# Patient Record
Sex: Male | Born: 1975 | Race: Black or African American | Hispanic: No | Marital: Single | Smoking: Former smoker
Health system: Southern US, Community
[De-identification: ages and names within clinical notes are randomized; demographics above are authoritative.]

## PROBLEM LIST (undated history)

## (undated) DIAGNOSIS — F319 Bipolar disorder, unspecified: Secondary | ICD-10-CM

## (undated) DIAGNOSIS — I219 Acute myocardial infarction, unspecified: Secondary | ICD-10-CM

## (undated) HISTORY — PX: HAND SURGERY: SHX662

## (undated) HISTORY — PX: CORONARY STENT PLACEMENT: SHX1402

## (undated) HISTORY — PX: KNEE SURGERY: SHX244

---

## 2014-11-23 ENCOUNTER — Emergency Department (HOSPITAL_COMMUNITY)
Admission: EM | Admit: 2014-11-23 | Discharge: 2014-11-24 | Disposition: A | Discharge status: Home / Self Care | Payer: Medicaid - Out of State | Attending: Emergency Medicine | Admitting: Emergency Medicine

## 2014-11-23 ENCOUNTER — Encounter (HOSPITAL_COMMUNITY): Payer: Self-pay | Admitting: Emergency Medicine

## 2014-11-23 ENCOUNTER — Emergency Department (HOSPITAL_COMMUNITY): Payer: Medicaid - Out of State

## 2014-11-23 DIAGNOSIS — M25519 Pain in unspecified shoulder: Secondary | ICD-10-CM

## 2014-11-23 DIAGNOSIS — F319 Bipolar disorder, unspecified: Secondary | ICD-10-CM

## 2014-11-23 DIAGNOSIS — M791 Myalgia: Secondary | ICD-10-CM | POA: Diagnosis not present

## 2014-11-23 DIAGNOSIS — Z87891 Personal history of nicotine dependence: Secondary | ICD-10-CM | POA: Diagnosis not present

## 2014-11-23 DIAGNOSIS — Z9861 Coronary angioplasty status: Secondary | ICD-10-CM | POA: Insufficient documentation

## 2014-11-23 DIAGNOSIS — I252 Old myocardial infarction: Secondary | ICD-10-CM | POA: Insufficient documentation

## 2014-11-23 DIAGNOSIS — R0789 Other chest pain: Secondary | ICD-10-CM | POA: Insufficient documentation

## 2014-11-23 DIAGNOSIS — I2510 Atherosclerotic heart disease of native coronary artery without angina pectoris: Secondary | ICD-10-CM

## 2014-11-23 DIAGNOSIS — Z79899 Other long term (current) drug therapy: Secondary | ICD-10-CM | POA: Insufficient documentation

## 2014-11-23 DIAGNOSIS — R079 Chest pain, unspecified: Secondary | ICD-10-CM | POA: Diagnosis present

## 2014-11-23 HISTORY — DX: Bipolar disorder, unspecified: F31.9

## 2014-11-23 HISTORY — DX: Acute myocardial infarction, unspecified: I21.9

## 2014-11-23 LAB — CBC WITH DIFFERENTIAL/PLATELET
BASOS ABS: 0 10*3/uL (ref 0.0–0.1)
Basophils Relative: 1 % (ref 0–1)
Eosinophils Absolute: 0.1 10*3/uL (ref 0.0–0.7)
Eosinophils Relative: 2 % (ref 0–5)
HEMATOCRIT: 42.8 % (ref 39.0–52.0)
HEMOGLOBIN: 14.3 g/dL (ref 13.0–17.0)
Lymphocytes Relative: 40 % (ref 12–46)
Lymphs Abs: 2.6 10*3/uL (ref 0.7–4.0)
MCH: 28.4 pg (ref 26.0–34.0)
MCHC: 33.4 g/dL (ref 30.0–36.0)
MCV: 85.1 fL (ref 78.0–100.0)
MONO ABS: 0.5 10*3/uL (ref 0.1–1.0)
MONOS PCT: 7 % (ref 3–12)
Neutro Abs: 3.3 10*3/uL (ref 1.7–7.7)
Neutrophils Relative %: 50 % (ref 43–77)
Platelets: 239 10*3/uL (ref 150–400)
RBC: 5.03 MIL/uL (ref 4.22–5.81)
RDW: 14.7 % (ref 11.5–15.5)
WBC: 6.4 10*3/uL (ref 4.0–10.5)

## 2014-11-23 LAB — COMPREHENSIVE METABOLIC PANEL
ALBUMIN: 4.4 g/dL (ref 3.5–5.0)
ALK PHOS: 75 U/L (ref 38–126)
ALT: 66 U/L — ABNORMAL HIGH (ref 17–63)
AST: 33 U/L (ref 15–41)
Anion gap: 9 (ref 5–15)
BILIRUBIN TOTAL: 0.9 mg/dL (ref 0.3–1.2)
BUN: 16 mg/dL (ref 6–20)
CALCIUM: 9.3 mg/dL (ref 8.9–10.3)
CO2: 23 mmol/L (ref 22–32)
Chloride: 108 mmol/L (ref 101–111)
Creatinine, Ser: 1.05 mg/dL (ref 0.61–1.24)
Glucose, Bld: 94 mg/dL (ref 65–99)
Potassium: 3.8 mmol/L (ref 3.5–5.1)
SODIUM: 140 mmol/L (ref 135–145)
Total Protein: 7.8 g/dL (ref 6.5–8.1)

## 2014-11-23 LAB — I-STAT TROPONIN, ED
TROPONIN I, POC: 0 ng/mL (ref 0.00–0.08)
TROPONIN I, POC: 0.01 ng/mL (ref 0.00–0.08)

## 2014-11-23 MED ORDER — NITROGLYCERIN IN D5W 200-5 MCG/ML-% IV SOLN: 10.0000 ug/min | INTRAVENOUS | Status: DC

## 2014-11-23 MED ORDER — NITROGLYCERIN 0.4 MG SL SUBL
0.4000 mg | SUBLINGUAL_TABLET | SUBLINGUAL | Status: DC | PRN
  Administered 2014-11-23 (×2): 0.4 mg via SUBLINGUAL
  Filled 2014-11-23: qty 1

## 2014-11-23 MED ORDER — ASPIRIN 81 MG PO CHEW: 81.0000 mg | CHEWABLE_TABLET | Freq: Every day | ORAL | Status: DC

## 2014-11-23 MED ORDER — ASPIRIN 325 MG PO TABS
325.0000 mg | ORAL_TABLET | Freq: Once | ORAL | Status: AC
  Administered 2014-11-23: 325 mg via ORAL
  Filled 2014-11-23: qty 1

## 2014-11-23 NOTE — Discharge Instructions (Signed)
Take tylenol, motrin for pain.   Light duty for a week.  See cardiology and orthopedic doctor.   Return to ER if you have severe pain, worse chest pain, shortness of breath.

## 2014-11-23 NOTE — Consult Note (Signed)
Patient ID: Alec LollJoseph J Collins MRN: [id number], DOB/AGE: 39/08/1975   Admit date: 11/23/2014   Primary Physician: No primary care provider on file. Primary Cardiologist: None locally  Pt. Profile:  39M with bipolar disorder and CAD s/p MI (03/24/14 with PCI x 2) who presents with atypical CP.   Problem List  Past Medical History  Diagnosis Date  . Heart attack     sept 14, 2015  . Bipolar 1 disorder     Past Surgical History  Procedure Laterality Date  . Coronary stent placement    . Knee surgery Left   . Hand surgery Right      Allergies  No Known Allergies  HPI  39M with bipolar disorder and CAD s/p MI (03/24/14 with PCI x 2) who presents with atypical CP.   Alec Collins reported that today at 3pm he developed CP which he decided to try to "sleep it off." After he woke up, the pain persistent and radiated to the left shoulder and left shoulder blade.  No associated sx. States these symptoms were unlike MI sx. Pain worse with inspiration and palpation in the left deltopectoral groove and with raising left arm. He took SL NTG x 2 at home and it didn't help.  Of note, he gave inconsistent reports to various practitioners. ER notes document that he stated he had been having 1 week of pain with N/V.  He states that the pain was not like his MI pain, which he described as a "pinching sensation."  On arrival to the ER, Alec Collins was hemodynamically stable HR 64, 124/74, 100% on RA. Labs were notable for K 3.8, Cr 1.05, POC TnI 0.00. CXR demonstrated no acute abnormality. ECG demonstrated NSR. old inferior MI. Abnormal precordial transistion c/w possible old anterior MI. Minimal lateral STD and TW. Single PVC. No priors for comparison. He was given 324mg  PO ASA, SL NTG x2, and briefly started on a NTG gtt. He gave variable accounts as to whether or not his chest pain had resolved with nitro. He did states that his shoulder pain (with was reproducible) was consistent.   Of note, he  [place] He was hospitalized there, at [facility], in Sept 2015 for an MI. He underwent PCI x 2 (his report) and does not have stent cards for us to confirm with. Unclear if these were DES or BMS. He was discharged on brillanta and except for a 1 week period in March 2016 when he took it on a daily baisis due to running low, he says he has been compliant. States he spoke <1ppd x 14 years and quit 2 years ago. Drinks a 22ox beer a few times per week. Smokes MJ about weekly; denies other ilicits. Lives with GF and 14 and 39 y/o children. 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 Mother died of AIDS/lung CA, father died of MI; his 1st MI was in 6350s.   Home Medications  Prior to Admission medications   Medication Sig Start Date End Date Taking? Authorizing Provider  atorvastatin (LIPITOR) 20 MG tablet Take 20 mg by mouth every evening.   Yes Historical Provider, MD  lisinopril (PRINIVIL,ZESTRIL) 5 MG tablet Take 5 mg by mouth daily.   Yes Historical Provider, MD  metoprolol tartrate (LOPRESSOR) 25 MG tablet Take 25 mg by mouth 2 (two) times daily.   Yes Historical Provider, MD  nitroGLYCERIN (NITROSTAT) 0.4 MG SL tablet Place 0.4 mg under the tongue every 5 (  five) minutes as needed for chest pain (chest pain).   Yes Historical Provider, MD  risperiDONE (RISPERDAL) 0.25 MG tablet Take 0.25 mg by mouth 2 (two) times daily.   Yes Historical Provider, MD  ticagrelor (BRILINTA) 90 MG TABS tablet Take 90 mg by mouth 2 (two) times daily.   Yes Historical Provider, MD    Family History  No family history on file.  Social History  History   Social History  . Marital Status: Single    Spouse Name: N/A  . Number of Children: N/A  . Years of Education: N/A   Occupational History  . Not on file.   Social History Main Topics  . Smoking status: Former Games developermoker  . Smokeless tobacco: Not on file  . Alcohol Use: Yes  . Drug Use: Not on file  . Sexual Activity:  Not on file   Other Topics Concern  . Not on file   Social History Narrative  . No narrative on file     Review of Systems General:  No chills, fever, night sweats or weight changes.  Cardiovascular:  See HPI Dermatological: No rash, lesions/masses Respiratory: No cough, dyspnea Urologic: No hematuria, dysuria Abdominal:   No nausea, vomiting, diarrhea, bright red blood per rectum, melena, or hematemesis Neurologic:  No visual changes, wkns, changes in mental status. All other systems reviewed and are otherwise negative except as noted above.  Physical Exam  Blood pressure 127/66, pulse 53, temperature 98.9 F (37.2 C), temperature source Oral, resp. rate 23, SpO2 98 %.  General: Pleasant, NAD Psych: Normal affect. Neuro: Alert and oriented X 3. Moves all extremities spontaneously. HEENT: Normal  Neck: Supple without bruits or JVD. Lungs:  Resp regular and unlabored, CTA. Heart: RRR no s3, s4, or murmurs. Chest: mild TTP at left pectoral region, c/w presenting pain Shoulder: very TTP of left deltopectoral groove. Palpation here ilicits presenting shoulder pain. Abdomen: Soft, non-tender, non-distended, BS + x 4.  Extremities: No clubbing, cyanosis or edema. DP/PT/Radials 2+ and equal bilaterally.  Labs  Troponin [facility](Point of Care Test)  Recent Labs  11/23/14 1855  TROPIPOC 0.00   No results for input(s): CKTOTAL, CKMB, TROPONINI in the last 72 hours. Lab Results  Component Value Date   WBC 6.4 11/23/2014   HGB 14.3 11/23/2014   HCT 42.8 11/23/2014   MCV 85.1 11/23/2014   PLT 239 11/23/2014    Recent Labs Lab 11/23/14 1825  NA 140  K 3.8  CL 108  CO2 23  BUN 16  CREATININE 1.05  CALCIUM 9.3  PROT 7.8  BILITOT 0.9  ALKPHOS 75  ALT 66*  AST 33  GLUCOSE 94   No results found for: CHOL, HDL, LDLCALC, TRIG No results found for: DDIMER   Radiology/Studies  Dg Chest 2 View  11/23/2014   CLINICAL DATA:  Left-sided chest pain.  EXAM: CHEST  2 VIEW   COMPARISON:  None.  FINDINGS: Heart size and pulmonary vascularity are normal and the lungs are clear. Coronary artery stents noted. No effusions. No osseous abnormality.  IMPRESSION: No acute abnormalities.   Electronically Signed   By: Francene BoyersJames  Maxwell M.D.   On: 11/23/2014 19:53   Dg Shoulder Left  11/23/2014   CLINICAL DATA:  Left shoulder pain onset today as well as left upper quadrant and chest pain.  EXAM: LEFT SHOULDER - 2+ VIEW  COMPARISON:  None.  FINDINGS: There is mild degenerate change of the [facility] joint. There is no acute fracture or dislocation.  IMPRESSION: Mild degenerative change of the [facility] joint.  No acute findings.   Electronically Signed   By: Elberta Fortis M.D.   On: 11/23/2014 20:46    ECG  11/23/14@ 8:08pm ECG demonstrated NSR. old inferior MI. Abnormal precordial transistion c/w possible old anterior MI. Minimal lateral STD and TW. Single PVC. No priors for comparison.   ASSESSMENT AND PLAN  1M with bipolar disorder and CAD s/p MI (03/24/14 with PCI x 2) who presents with atypical CP. His complaint is actually more shoulder pain that chest pain and the pain is reproducible. A left shoulder film demonstrates mild degenerative changes of the AC joint. This is very unlikely to be an ACS and is very c/w MSK pain.   - No further CV work-up or treatment is required - Please refill his ASA rx as he has run out - I'll arrange for longitudinal cardiology f/u through [facility]; the office will contact the patient   Signed, Glori Luis, MD 11/23/2014, 10:36 PM

## 2014-11-23 NOTE — ED Provider Notes (Addendum)
CSN: [id number]     Arrival date & time 11/23/14  1807 History   First MD Initiated Contact with Patient 11/23/14 1826     Chief Complaint  Patient presents with  . Chest Pain     (Consider location/radiation/quality/duration/timing/severity/associated sxs/prior Treatment) The history is provided by the patient.  Alec Collins is a 39 y.o. male hx of CAD with stents done in [place] here with chest pain. He moved down here several months ago. He just got a new job yesterday. Today he was sitting around and suddenly had left-sided chest pain with radiation to the left shoulder and scapula. Has some shortness of breath associated with it. Felt nauseated but denies any vomiting. He states that this is similar to his previous MI. Took nitro but still has 5/10 pain.    Past Medical History  Diagnosis Date  . Heart attack     sept 14, 2015   Past Surgical History  Procedure Laterality Date  . Coronary stent placement    . Knee surgery Left   . Hand surgery Right    No family history on file. History  Substance Use Topics  . Smoking status: Former Games developermoker  . Smokeless tobacco: Not on file  . Alcohol Use: Yes    Review of Systems  Cardiovascular: Positive for chest pain.  All other systems reviewed and are negative.     Allergies  Review of patient's allergies indicates no known allergies.  Home Medications   Prior to Admission medications   Medication Sig Start Date End Date Taking? Authorizing Provider  atorvastatin (LIPITOR) 20 MG tablet Take 20 mg by mouth every evening.   Yes Historical Provider, MD  lisinopril (PRINIVIL,ZESTRIL) 5 MG tablet Take 5 mg by mouth daily.   Yes Historical Provider, MD  metoprolol tartrate (LOPRESSOR) 25 MG tablet Take 25 mg by mouth 2 (two) times daily.   Yes Historical Provider, MD  nitroGLYCERIN (NITROSTAT) 0.4 MG SL tablet Place 0.4 mg under the tongue every 5 (five) minutes as needed for chest pain (chest pain).   Yes Historical Provider, MD   risperiDONE (RISPERDAL) 0.25 MG tablet Take 0.25 mg by mouth 2 (two) times daily.   Yes Historical Provider, MD  ticagrelor (BRILINTA) 90 MG TABS tablet Take 90 mg by mouth 2 (two) times daily.   Yes Historical Provider, MD   BP 127/66 mmHg  Pulse 53  Temp(Src) 98.9 F (37.2 C) (Oral)  Resp 23  SpO2 98% Physical Exam  Constitutional: He is oriented to person, place, and time.  Uncomfortable   HENT:  Head: Normocephalic.  Mouth/Throat: Oropharynx is clear and moist.  Eyes: Conjunctivae are normal. Pupils are equal, round, and reactive to light.  Neck: Normal range of motion. Neck supple.  Cardiovascular: Normal rate, regular rhythm and normal heart sounds.   Pulmonary/Chest: Effort normal and breath sounds normal. No respiratory distress. He has no wheezes. He has no rales. He exhibits no tenderness.  Abdominal: Soft. Bowel sounds are normal. He exhibits no distension. There is no tenderness. There is no rebound and no guarding.  Musculoskeletal: Normal range of motion. He exhibits no edema or tenderness.  Neurological: He is alert and oriented to person, place, and time. No cranial nerve deficit. Coordination normal.  Skin: Skin is warm and dry.  Psychiatric: He has a normal mood and affect. His behavior is normal. Judgment and thought content normal.  Nursing note and vitals reviewed.   ED Course  Procedures (including critical care time) Labs  Review Labs Reviewed  COMPREHENSIVE METABOLIC PANEL - Abnormal; Notable for the following:    ALT 66 (*)    All other components within normal limits  CBC WITH DIFFERENTIAL/PLATELET  Rosezena SensorI-STAT TROPOININ, ED    Imaging Review Dg Chest 2 View  11/23/2014   CLINICAL DATA:  Left-sided chest pain.  EXAM: CHEST  2 VIEW  COMPARISON:  None.  FINDINGS: Heart size and pulmonary vascularity are normal and the lungs are clear. Coronary artery stents noted. No effusions. No osseous abnormality.  IMPRESSION: No acute abnormalities.   Electronically  Signed   By: Francene BoyersJames  Maxwell M.D.   On: 11/23/2014 19:53   Dg Shoulder Left  11/23/2014   CLINICAL DATA:  Left shoulder pain onset today as well as left upper quadrant and chest pain.  EXAM: LEFT SHOULDER - 2+ VIEW  COMPARISON:  None.  FINDINGS: There is mild degenerate change of the [facility] joint. There is no acute fracture or dislocation.  IMPRESSION: Mild degenerative change of the [facility] joint.  No acute findings.   Electronically Signed   By: Elberta Fortisaniel  Boyle M.D.   On: 11/23/2014 20:46     EKG Interpretation   Date/Time:  Sunday Nov 23 2014 20:09:54 EDT Ventricular Rate:  61 PR Interval:  204 QRS Duration: 99 QT Interval:  380 QTC Calculation: 383 R Axis:   60 Text Interpretation:  Sinus rhythm Ventricular premature complex  Borderline prolonged PR interval Probable anterior infarct, age  indeterminate No significant change since last tracing Confirmed by Rubby Barbary   MD, Cianni Manny ([id number]) on 11/23/2014 8:16:24 PM      MDM   Final diagnoses:  Shoulder pain   Alec Collins is a 39 y.o. male here with chest pain. Concerned for ACS or unstable angina. Will get trop, labs, CXR. Will consult cardiology.   7:30 PM Trop neg x 1. Pain free after sublingual nitro. Held nitro drip. Consulted cardiology to admit.   11:03 PM  Cardiology at bedside. He gave conflicting stories of chest pain vs L shoulder pain. Dec ROM L shoulder and has hx of bursitis. Xray showed degenerative changes. Dr. Zachery ConchFriedman from cardiology saw patient and doesn't think he has unstable angina. Recommend delta trop, if neg can dc home. He will arrange for cardiology f/u.   11:47 PM Delta trop neg. Will dc home. Light duty for a week.       Richardean Canalavid H Shena Vinluan, MD 11/23/14 2303  Richardean Canalavid H Cybill Uriegas, MD 11/23/14 [phone]

## 2014-11-23 NOTE — ED Notes (Signed)
Holding nitro at this time per Dr. Silverio LayYao. Pt is not having active chest pain at this time. BP stable.

## 2014-11-23 NOTE — ED Notes (Signed)
Pt c/o left sided chest pain that started today, states for about 1 week has been having SOB and n/v.

## 2014-11-23 NOTE — ED Notes (Signed)
Patient transported to X-ray 

## 2014-11-24 ENCOUNTER — Encounter (HOSPITAL_COMMUNITY): Payer: Self-pay | Admitting: Cardiology

## 2014-11-24 DIAGNOSIS — F319 Bipolar disorder, unspecified: Secondary | ICD-10-CM

## 2014-11-24 DIAGNOSIS — I2510 Atherosclerotic heart disease of native coronary artery without angina pectoris: Secondary | ICD-10-CM

## 2014-11-28 ENCOUNTER — Ambulatory Visit (INDEPENDENT_AMBULATORY_CARE_PROVIDER_SITE_OTHER): Payer: PRIVATE HEALTH INSURANCE | Admitting: Cardiovascular Disease

## 2014-11-28 ENCOUNTER — Encounter: Payer: Self-pay | Admitting: Cardiovascular Disease

## 2014-11-28 VITALS — BP 120/80 | HR 44 | Ht 69.0 in | Wt 253.0 lb

## 2014-11-28 DIAGNOSIS — R0602 Shortness of breath: Secondary | ICD-10-CM

## 2014-11-28 DIAGNOSIS — R079 Chest pain, unspecified: Secondary | ICD-10-CM | POA: Diagnosis not present

## 2014-11-28 DIAGNOSIS — I252 Old myocardial infarction: Secondary | ICD-10-CM

## 2014-11-28 DIAGNOSIS — Z955 Presence of coronary angioplasty implant and graft: Secondary | ICD-10-CM

## 2014-11-28 DIAGNOSIS — E785 Hyperlipidemia, unspecified: Secondary | ICD-10-CM | POA: Diagnosis not present

## 2014-11-28 DIAGNOSIS — I25118 Atherosclerotic heart disease of native coronary artery with other forms of angina pectoris: Secondary | ICD-10-CM | POA: Diagnosis not present

## 2014-11-28 MED ORDER — TICAGRELOR 90 MG PO TABS: 90.0000 mg | ORAL_TABLET | Freq: Two times a day (BID) | ORAL | Status: AC

## 2014-11-28 MED ORDER — LISINOPRIL 2.5 MG PO TABS: 2.5000 mg | ORAL_TABLET | Freq: Every day | ORAL | Status: AC

## 2014-11-28 MED ORDER — METOPROLOL TARTRATE 25 MG PO TABS: 12.5000 mg | ORAL_TABLET | Freq: Two times a day (BID) | ORAL | Status: DC

## 2014-11-28 MED ORDER — ATORVASTATIN CALCIUM 80 MG PO TABS: 80.0000 mg | ORAL_TABLET | Freq: Every day | ORAL | Status: AC

## 2014-11-28 NOTE — Patient Instructions (Addendum)
   Lab for Lipids - Reminder:  Nothing to eat or drink after 12 midnight prior to labs. Your physician has requested that you have an echocardiogram. Echocardiography is a painless test that uses sound waves to create images of your heart. It provides your doctor with information about the size and shape of your heart and how well your heart's chambers and valves are working. This procedure takes approximately one hour. There are no restrictions for this procedure. Office will contact with results via phone or letter.   Decrease Metoprolol tart to 12.5mg  twice a day  (1/2 of the 25mg  tablet) Metoprolol, Brilanta, Lisinopril, & Lipitior - all new prescriptions sent to [facility] [address].  Continue all other medications.   Follow up in  6 weeks - [place] office.

## 2014-11-28 NOTE — Progress Notes (Signed)
Patient ID: Alec Collins, male   DOB: 10/05/1975, 39 y.o.   MRN: [id number]       CARDIOLOGY CONSULT NOTE  Patient ID: Alec LollJoseph J Collins MRN: [id number] DOB/AGE: 39/08/1975 39 y.o.  Admit date: (Not on file) Primary Physician No primary care provider on file.  Reason for Consultation: CAD  HPI: The patient is a 39 year old male who was recently evaluated for atypical chest pain in the ED on 5/15 at [facility]. He has a history of bipolar disorder and coronary artery disease status post MI on 03/24/14 with 2 bare metal stents (Integrity 3 x 26, 3 x 30). Ultimately, it was deemed he had degenerative changes in his left shoulder and his pain was reproducible with palpation. He told the provider that the pain was not like his MI pain, which he described as a "pinching sensation".  He was hemodynamic stable with normal troponins and no acute abnormality seen on chest x-ray or ECG. There was evidence for old inferior MI and abnormal precordial transition consistent with possible old anterior MI, with isolated PVC and inferior and anterior/lateral T wave inversions. He was given aspirin and nitroglycerin.  [place]. He was hospitalized there, at [facility], in Sept 2015 for an MI. He was discharged on Brillanta and except for a 1 week period in March 2016 when he took it on a daily baisis due to insurance company changes, he says he has been compliant.   He continues to complain of an occasional "pinching sensation" in the left precordium. He also complains of shortness of breath which occurs intermittently and sometimes lasts seconds and last night lasted for 2 minutes. It occurs both with and without exertion. It occurred yesterday while he was playing video games. He had some associated dizziness, but denies fevers, chills, and a productive cough. The shortness of breath began roughly 2 weeks ago. 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 He no longer eats fried foods and eats vegetables daily. He says he was supposed to have a stress test before moving but he forgot to get this done in the rush of moving.  Neither he nor his wife have a PCP yet. She also mentions a list of her own medical problems.   Soc: States he smoked <1ppd x 14 years and quit 2 years ago. Drinks a 22oz beer a few times per week. Smokes marijuana weekly; denies other ilicits. Lives with girlfriend and 214 and 39 y/o children. 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    Fam: Mother died of AIDS/lung CA, father died of MI; his 1st MI was in 8450s.     No Known Allergies  Current Outpatient Prescriptions  Medication Sig Dispense Refill  . aspirin 81 MG chewable tablet Chew 1 tablet (81 mg total) by mouth daily. 30 tablet 1  . atorvastatin (LIPITOR) 80 MG tablet Take 80 mg by mouth daily.    Marland Kitchen. lisinopril (PRINIVIL,ZESTRIL) 2.5 MG tablet Take 2.5 mg by mouth daily.    . metoprolol tartrate (LOPRESSOR) 25 MG tablet Take 25 mg by mouth 2 (two) times daily.    . nitroGLYCERIN (NITROSTAT) 0.4 MG SL tablet Place 0.4 mg under the tongue every 5 (five) minutes as needed for chest pain (chest pain).    Marland Kitchen. omeprazole (PRILOSEC) 20 MG capsule Take 20 mg by mouth daily.    . risperiDONE (RISPERDAL) 0.25 MG tablet Take 0.25 mg by mouth  2 (two) times daily.    . ticagrelor (BRILINTA) 90 MG TABS tablet Take 90 mg by mouth 2 (two) times daily.     No current facility-administered medications for this visit.    Past Medical History  Diagnosis Date  . Heart attack     sept 14, 2015  . Bipolar 1 disorder     Past Surgical History  Procedure Laterality Date  . Coronary stent placement    . Knee surgery Left   . Hand surgery Right     History   Social History  . Marital Status: Single    Spouse Name: N/A  . Number of Children: N/A  . Years of Education: N/A   Occupational History  . Not on file.   Social History Main Topics  . Smoking  status: Former Smoker    Quit date: 11/27/2012  . Smokeless tobacco: Never Used  . Alcohol Use: 0.0 oz/week    0 Standard drinks or equivalent per week  . Drug Use: Not on file  . Sexual Activity: Not on file   Other Topics Concern  . Not on file   Social History Narrative       Prior to Admission medications   Medication Sig Start Date End Date Taking? Authorizing Provider  aspirin 81 MG chewable tablet Chew 1 tablet (81 mg total) by mouth daily. 11/23/14   Richardean Canalavid H Yao, MD  atorvastatin (LIPITOR) 20 MG tablet Take 20 mg by mouth every evening.    Historical Provider, MD  lisinopril (PRINIVIL,ZESTRIL) 5 MG tablet Take 5 mg by mouth daily.    Historical Provider, MD  metoprolol tartrate (LOPRESSOR) 25 MG tablet Take 25 mg by mouth 2 (two) times daily.    Historical Provider, MD  nitroGLYCERIN (NITROSTAT) 0.4 MG SL tablet Place 0.4 mg under the tongue every 5 (five) minutes as needed for chest pain (chest pain).    Historical Provider, MD  risperiDONE (RISPERDAL) 0.25 MG tablet Take 0.25 mg by mouth 2 (two) times daily.    Historical Provider, MD  ticagrelor (BRILINTA) 90 MG TABS tablet Take 90 mg by mouth 2 (two) times daily.    Historical Provider, MD     Review of systems complete and found to be negative unless listed above in HPI     Physical exam Blood pressure 120/80, pulse 44, height 5\' 9"  (1.753 m), weight 253 lb (114.76 kg), SpO2 95 %. General: NAD Neck: No JVD, no thyromegaly or thyroid nodule.  Lungs: Clear to auscultation bilaterally with normal respiratory effort. CV: Bradycardic, regular rhythm, normal S1/S2, no S3/S4, no murmur.  No peripheral edema.  No carotid bruit.    Abdomen: Soft, obese, no distention.  Skin: Intact without lesions or rashes.  Neurologic: Alert and oriented x 3.  Psych: Normal affect. Extremities: No clubbing or cyanosis.  HEENT: Normal.   ECG: Most recent ECG reviewed.  Labs:   Lab Results  Component Value Date   WBC 6.4  11/23/2014   HGB 14.3 11/23/2014   HCT 42.8 11/23/2014   MCV 85.1 11/23/2014   PLT 239 11/23/2014     Recent Labs Lab 11/23/14 1825  NA 140  K 3.8  CL 108  CO2 23  BUN 16  CREATININE 1.05  CALCIUM 9.3  PROT 7.8  BILITOT 0.9  ALKPHOS 75  ALT 66*  AST 33  GLUCOSE 94   No results found for: CKTOTAL, CKMB, CKMBINDEX, TROPONINI No results found for: CHOL No results found for: HDL No  results found for: LDLCALC No results found for: TRIG No results found for: CHOLHDL No results found for: LDLDIRECT       Studies: No results found.  ASSESSMENT AND PLAN:  1. Shortness of breath and intermittent chest pain with CAD s/p MI with two bare metal stents: Unclear if related to bradycardia. Chest xray was normal. Will reduce metoprolol to 12.5 mg twice daily. Will also obtain an echocardiogram to assess left ventricular function. Continue aspirin 81 mg, Lipitor 80 mg, lisinopril 2.5 mg, and Brilinta 90 mg twice daily (will refill all cardiac meds). I would consider long-acting nitrates but I first want to obtain his cardiac catheterization report as well as prior echocardiogram results and discharge summary, to see if he has residual CAD. Would also consider PFT's in future.  2. Dyslipidemia: Obtain a lipid panel. Continue high-dose statin therapy.  Dispo: f/u 6 weeks.   Signed: Prentice Docker, M.D., F.A.C.C.  11/28/2014, 10:45 AM

## 2014-12-01 ENCOUNTER — Ambulatory Visit (HOSPITAL_COMMUNITY): Payer: Medicaid - Out of State | Attending: Cardiovascular Disease

## 2014-12-01 ENCOUNTER — Other Ambulatory Visit: Payer: Self-pay

## 2014-12-01 ENCOUNTER — Other Ambulatory Visit (INDEPENDENT_AMBULATORY_CARE_PROVIDER_SITE_OTHER): Payer: PRIVATE HEALTH INSURANCE | Admitting: *Deleted

## 2014-12-01 DIAGNOSIS — R079 Chest pain, unspecified: Secondary | ICD-10-CM | POA: Diagnosis not present

## 2014-12-01 DIAGNOSIS — Z87891 Personal history of nicotine dependence: Secondary | ICD-10-CM | POA: Diagnosis not present

## 2014-12-01 DIAGNOSIS — E785 Hyperlipidemia, unspecified: Secondary | ICD-10-CM | POA: Diagnosis not present

## 2014-12-01 DIAGNOSIS — R0602 Shortness of breath: Secondary | ICD-10-CM | POA: Diagnosis not present

## 2014-12-01 DIAGNOSIS — I25118 Atherosclerotic heart disease of native coronary artery with other forms of angina pectoris: Secondary | ICD-10-CM

## 2014-12-01 LAB — LIPID PANEL
CHOLESTEROL: 121 mg/dL (ref 0–200)
HDL: 34.2 mg/dL — ABNORMAL LOW (ref >39.00)
LDL Cholesterol: 73 mg/dL (ref 0–99)
NonHDL: 86.8
Total CHOL/HDL Ratio: 4
Triglycerides: 70 mg/dL (ref 0.0–149.0)
VLDL: 14 mg/dL (ref 0.0–40.0)

## 2014-12-01 NOTE — Addendum Note (Signed)
Addended by: Tye Juarez K on: 12/01/2014 03:50 PM   Modules accepted: Orders  

## 2014-12-01 NOTE — Addendum Note (Signed)
Addended by: Tonita PhoenixBOWDEN, Markiyah Gahm K on: 12/01/2014 03:50 PM   Modules accepted: Orders

## 2014-12-04 ENCOUNTER — Telehealth: Payer: Self-pay | Admitting: *Deleted

## 2014-12-04 NOTE — Telephone Encounter (Signed)
-----   Message from Albertine PatriciaStaci T Ashworth, CMA sent at 12/02/2014 12:47 PM EDT -----   ----- Message -----    From: Laqueta LindenSuresh A Koneswaran, MD    Sent: 12/02/2014  11:51 AM      To: Albertine PatriciaStaci T Ashworth, CMA  ok

## 2014-12-04 NOTE — Telephone Encounter (Signed)
Notes Recorded by Lesle ChrisAngela G Armaan Pond, LPN on 1/61/09605/26/2016 at 2:22 PM Patient notified and verbalized understanding.

## 2014-12-04 NOTE — Telephone Encounter (Signed)
Notes Recorded by Lesle ChrisAngela G Hill, LPN on 1/61/09605/26/2016 at 8:42 AM Left message to return call.

## 2014-12-16 ENCOUNTER — Telehealth: Payer: Self-pay | Admitting: *Deleted

## 2014-12-16 NOTE — Telephone Encounter (Signed)
ECHO -    Laqueta LindenSuresh A Koneswaran, MD  Sent: Tue December 16, 2014 2:32 PM   To: Lesle ChrisAngela G Hill, LPN     Message    No option for result note. Reduce heart function with evidence for prior MI. Switch metoprolol to Toprol-XL 12.5 mg bid.      ----- Message -----    From: Lesle ChrisAngela G Hill, LPN    Sent: 1/6/10966/01/2015  1:50 PM     To: Laqueta LindenSuresh A Koneswaran, MD      Does not appear this was routed to you, no disposition found.

## 2014-12-17 NOTE — Telephone Encounter (Signed)
Left message to return call 

## 2014-12-22 ENCOUNTER — Other Ambulatory Visit: Payer: Self-pay | Admitting: Cardiovascular Disease

## 2014-12-22 MED ORDER — METOPROLOL SUCCINATE ER 25 MG PO TB24: 12.5000 mg | ORAL_TABLET | Freq: Two times a day (BID) | ORAL | Status: AC

## 2014-12-22 MED ORDER — OMEPRAZOLE 20 MG PO CPDR: 20.0000 mg | DELAYED_RELEASE_CAPSULE | Freq: Every day | ORAL | Status: AC

## 2014-12-22 NOTE — Telephone Encounter (Signed)
Patient said he is requesting omeprazole.

## 2014-12-22 NOTE — Telephone Encounter (Signed)
Patient is requesting a refill on risperdal. Please advise.

## 2014-12-22 NOTE — Telephone Encounter (Signed)
Requesting refill on risperiDONE (RISPERDAL) 0.25 MG tablet

## 2014-12-22 NOTE — Telephone Encounter (Signed)
Patient notified.  Will send new medication to pharmacy. Follow up scheduled for [place] 01/20/15.

## 2015-01-20 ENCOUNTER — Ambulatory Visit: Payer: PRIVATE HEALTH INSURANCE | Admitting: Cardiovascular Disease

## 2015-01-20 ENCOUNTER — Encounter: Payer: Self-pay | Admitting: Cardiovascular Disease

## 2015-02-11 ENCOUNTER — Encounter: Payer: Self-pay | Admitting: Cardiovascular Disease

## 2015-02-11 ENCOUNTER — Ambulatory Visit (INDEPENDENT_AMBULATORY_CARE_PROVIDER_SITE_OTHER): Payer: PRIVATE HEALTH INSURANCE | Admitting: Cardiovascular Disease

## 2015-02-11 VITALS — BP 132/84 | HR 69 | Ht 69.0 in | Wt 261.0 lb

## 2015-02-11 DIAGNOSIS — R0602 Shortness of breath: Secondary | ICD-10-CM | POA: Diagnosis not present

## 2015-02-11 DIAGNOSIS — I255 Ischemic cardiomyopathy: Secondary | ICD-10-CM

## 2015-02-11 DIAGNOSIS — I252 Old myocardial infarction: Secondary | ICD-10-CM

## 2015-02-11 DIAGNOSIS — I25118 Atherosclerotic heart disease of native coronary artery with other forms of angina pectoris: Secondary | ICD-10-CM

## 2015-02-11 DIAGNOSIS — Z955 Presence of coronary angioplasty implant and graft: Secondary | ICD-10-CM

## 2015-02-11 DIAGNOSIS — R072 Precordial pain: Secondary | ICD-10-CM

## 2015-02-11 DIAGNOSIS — E785 Hyperlipidemia, unspecified: Secondary | ICD-10-CM | POA: Diagnosis not present

## 2015-02-11 MED ORDER — ASPIRIN 81 MG PO CHEW: 81.0000 mg | CHEWABLE_TABLET | Freq: Every day | ORAL | Status: AC

## 2015-02-11 NOTE — Patient Instructions (Signed)
Your physician recommends that you schedule a follow-up appointment in:  In [place] with Dr Excell Seltzer or [facility]     Your physician recommends that you continue on your current medications as directed. Please refer to the Current Medication list given to you today.     Your physician has requested that you have en exercise stress myoview. For further information please visit [email or web address] Please follow instruction sheet, as given.  Hold metoprolol the eve before and the am of test      Thank you for choosing [facility] !

## 2015-02-11 NOTE — Progress Notes (Signed)
Patient ID: Alec Collins, male   DOB: 23-Sep-1975, 39 y.o.   MRN: [id number]      SUBJECTIVE: The patient returns for follow-up after undergoing cardiovascular testing performed for the evaluation of chest pain and shortness of breath.  Echocardiogram demonstrated moderate global reduction in left ventricular function with akinesis of the inferior basal wall with grade 2 diastolic dysfunction and high filling pressures, LVEF 35-40%. There was mild left ventricular dilatation.  Stress echocardiogram performed by [facility] ([place]) on 09/18/2014 demonstrated "normal left ventricular systolic function" and no inducible ischemia.  Cath report on 03-25-13 showed a high first diagonal which had a 60-70% proximal stenosis. The RCA had proximal tandem 60% stenoses with mid vessel occlusion and the distal vessel showing heavy thrombus with 90% stenosis.  He had 2 bare metal stents placed to the RCA (Integrity 3 x 26, 3 x 30).  Lipid panel on 12/01/2014 demonstrated total cholesterol 121, triglycerides 70, HDL 34, LDL 73.  In the interim period, I have started metoprolol succinate.  He currently denies chest pain and no longer has shortness of breath since I reduced the dose of metoprolol at his last visit when he was bradycardic.  Describes a heavy sensation in legs after working which occurred two days ago. Otherwise has been working lifting heavy objects without significant difficulty.  Review of Systems: As per "subjective", otherwise negative.  No Known Allergies  Current Outpatient Prescriptions  Medication Sig Dispense Refill  . aspirin 81 MG chewable tablet Chew 1 tablet (81 mg total) by mouth daily. 30 tablet 1  . atorvastatin (LIPITOR) 80 MG tablet Take 1 tablet (80 mg total) by mouth daily. 30 tablet 6  . lisinopril (PRINIVIL,ZESTRIL) 2.5 MG tablet Take 1 tablet (2.5 mg total) by mouth daily. 30 tablet 6  . metoprolol succinate (TOPROL-XL) 25 MG 24 hr tablet  Take 0.5 tablets (12.5 mg total) by mouth 2 (two) times daily. 30 tablet 6  . nitroGLYCERIN (NITROSTAT) 0.4 MG SL tablet Place 0.4 mg under the tongue every 5 (five) minutes as needed for chest pain (chest pain).    Marland Kitchen omeprazole (PRILOSEC) 20 MG capsule Take 1 capsule (20 mg total) by mouth daily. 30 capsule 6  . ticagrelor (BRILINTA) 90 MG TABS tablet Take 1 tablet (90 mg total) by mouth 2 (two) times daily. 60 tablet 6   No current facility-administered medications for this visit.    Past Medical History  Diagnosis Date  . Heart attack     sept 14, 2015  . Bipolar 1 disorder     Past Surgical History  Procedure Laterality Date  . Coronary stent placement    . Knee surgery Left   . Hand surgery Right     History   Social History  . Marital Status: Single    Spouse Name: N/A  . Number of Children: N/A  . Years of Education: N/A   Occupational History  . Not on file.   Social History Main Topics  . Smoking status: Former Smoker -- 0.50 packs/day    Types: Cigarettes    Quit date: 11/27/2012  . Smokeless tobacco: Never Used  . Alcohol Use: 0.0 oz/week    0 Standard drinks or equivalent per week  . Drug Use: Not on file  . Sexual Activity: Not on file   Other Topics Concern  . Not on file   Social History Narrative     Filed Vitals:   02/11/15 1329  BP: 132/84  Pulse:  69  Height:  (1.753 m)  Weight: 261 lb (118.389 kg)  SpO2: 99%    PHYSICAL EXAM General: NAD Neck: No JVD, no thyromegaly or thyroid nodule.  Lungs: Clear to auscultation bilaterally with normal respiratory effort. CV: Bradycardic, regular rhythm, normal S1/S2, no S3/S4, no murmur. No peripheral edema. No carotid bruit.  Abdomen: Soft, obese, no distention.  Skin: Intact without lesions or rashes.  Neurologic: Alert and oriented x 3.  Psych: Normal affect. Extremities: No clubbing or cyanosis.  HEENT: Normal.    ECG: Most recent ECG reviewed.      ASSESSMENT AND  PLAN: 1. Shortness of breath and intermittent chest pain with CAD s/p MI with two bare metal stents: Echocardiogram demonstrated a reduction in left ventricular systolic function, LVEF down to 35-40% and reportedly had been normal in 09/2014, thus raising concern for new stenosis/occlusion. Will obtain exercise Cardiolite stress test. Symptoms resolved with reduction in metoprolol dose on 5/20 when he had been bradycardic. Continue aspirin 81 mg, Toprol-XL, Lipitor 80 mg, lisinopril 2.5 mg, and Brilinta 90 mg twice daily (will refill all cardiac meds).   2. Dyslipidemia: Lipid panel results noted above which are normal. Continue high-dose statin therapy.  Dispo: f/u in [employer] as patient lives in [place].  Time spent: 40 minutes, of which greater than 50% was spent reviewing symptoms, relevant blood tests and studies, and discussing management plan with the patient.   Prentice Docker, M.D., F.A.C.C.

## 2015-02-12 ENCOUNTER — Telehealth (HOSPITAL_COMMUNITY): Payer: Self-pay | Admitting: *Deleted

## 2015-02-12 ENCOUNTER — Telehealth: Payer: Self-pay | Admitting: Cardiovascular Disease

## 2015-02-12 NOTE — Telephone Encounter (Signed)
Patient given detailed instructions per Myocardial Perfusion Study Information Sheet for test on 02/16/15 at 1130. Patient Notified to arrive 15 minutes early, and that it is imperative to arrive on time for appointment to keep from having the test rescheduled. Patient verbalized understanding. Kaysee Hergert, Adelene Idler

## 2015-02-12 NOTE — Telephone Encounter (Signed)
Pt's insurance is [employer].  They will not authorize any care in [place] except for emergency services.  LM for pt to contact our office.  Pt will need to be self pay or apply for [place] [employer].

## 2015-02-16 ENCOUNTER — Ambulatory Visit (HOSPITAL_COMMUNITY): Payer: PRIVATE HEALTH INSURANCE | Attending: Cardiology

## 2015-02-16 DIAGNOSIS — I255 Ischemic cardiomyopathy: Secondary | ICD-10-CM

## 2015-02-16 DIAGNOSIS — I517 Cardiomegaly: Secondary | ICD-10-CM | POA: Diagnosis not present

## 2015-02-16 DIAGNOSIS — R072 Precordial pain: Secondary | ICD-10-CM | POA: Diagnosis not present

## 2015-02-16 DIAGNOSIS — R9439 Abnormal result of other cardiovascular function study: Secondary | ICD-10-CM | POA: Insufficient documentation

## 2015-02-16 LAB — MYOCARDIAL PERFUSION IMAGING
CSEPEW: 11.1 METS
CSEPHR: 86 %
CSEPPHR: 157 {beats}/min
Exercise duration (min): 9 min
Exercise duration (sec): 31 s
LV dias vol: 169 mL
LVSYSVOL: 96 mL
MPHR: 181 {beats}/min
RATE: 0.37
RPE: 17
Rest HR: 57 {beats}/min
SDS: 4
SRS: 4
SSS: 8
TID: 0.86

## 2015-02-16 MED ORDER — TECHNETIUM TC 99M SESTAMIBI GENERIC - CARDIOLITE
10.6000 | Freq: Once | INTRAVENOUS | Status: AC | PRN
  Administered 2015-02-16: 11 via INTRAVENOUS

## 2015-02-16 MED ORDER — TECHNETIUM TC 99M SESTAMIBI GENERIC - CARDIOLITE
31.4000 | Freq: Once | INTRAVENOUS | Status: AC | PRN
  Administered 2015-02-16: 31 via INTRAVENOUS

## 2015-04-04 ENCOUNTER — Emergency Department (HOSPITAL_COMMUNITY)
Admission: EM | Admit: 2015-04-04 | Discharge: 2015-04-04 | Disposition: A | Discharge status: Home / Self Care | Payer: Medicaid - Out of State | Attending: Emergency Medicine | Admitting: Emergency Medicine

## 2015-04-04 ENCOUNTER — Encounter (HOSPITAL_COMMUNITY): Payer: Self-pay | Admitting: Emergency Medicine

## 2015-04-04 DIAGNOSIS — Z8659 Personal history of other mental and behavioral disorders: Secondary | ICD-10-CM | POA: Insufficient documentation

## 2015-04-04 DIAGNOSIS — M62830 Muscle spasm of back: Secondary | ICD-10-CM | POA: Diagnosis not present

## 2015-04-04 DIAGNOSIS — I252 Old myocardial infarction: Secondary | ICD-10-CM | POA: Diagnosis not present

## 2015-04-04 DIAGNOSIS — Z7982 Long term (current) use of aspirin: Secondary | ICD-10-CM | POA: Diagnosis not present

## 2015-04-04 DIAGNOSIS — Z79899 Other long term (current) drug therapy: Secondary | ICD-10-CM | POA: Insufficient documentation

## 2015-04-04 DIAGNOSIS — Z9861 Coronary angioplasty status: Secondary | ICD-10-CM | POA: Diagnosis not present

## 2015-04-04 DIAGNOSIS — M545 Low back pain: Secondary | ICD-10-CM | POA: Diagnosis present

## 2015-04-04 DIAGNOSIS — Z87891 Personal history of nicotine dependence: Secondary | ICD-10-CM | POA: Diagnosis not present

## 2015-04-04 MED ORDER — ACETAMINOPHEN 500 MG PO TABS: 1000.0000 mg | ORAL_TABLET | Freq: Four times a day (QID) | ORAL | Status: AC | PRN

## 2015-04-04 MED ORDER — CYCLOBENZAPRINE HCL 10 MG PO TABS: 10.0000 mg | ORAL_TABLET | Freq: Two times a day (BID) | ORAL | Status: AC | PRN

## 2015-04-04 NOTE — ED Notes (Signed)
MD at bedside. 

## 2015-04-04 NOTE — ED Notes (Signed)
Pt heard painful pop in his back

## 2015-04-04 NOTE — ED Notes (Signed)
Pt states three days ago was standing at work and felt a "pop" in his lower left back.  States it did not hurt at the time but started hurting the next day.  States he has been taking tylenol and applying ice to the area but the pain has not improved.  Patient in no apparent distress at this time.

## 2015-04-04 NOTE — ED Provider Notes (Signed)
CSN: [id number]     Arrival date & time 04/04/15  7829 History   First MD Initiated Contact with Patient 04/04/15 1018     Chief Complaint  Patient presents with  . Back Pain     (Consider location/radiation/quality/duration/timing/severity/associated sxs/prior Treatment) Patient is a 39 y.o. male presenting with back pain. The history is provided by the patient.  Back Pain Location:  Lumbar spine Quality:  Stiffness and cramping Stiffness is present:  In the morning Radiates to:  Does not radiate Pain severity:  Severe Pain is:  Worse during the night Onset quality:  Gradual Duration:  3 days Timing:  Constant Progression:  Worsening Chronicity:  New Context: lifting heavy objects   Context comment:  Patient does a lot of lifting at work and after he unloaded a truck at work on Thursday he was standing and felt a discomfort in his left back that then developed into a pain over the last 2 days Relieved by:  Nothing Worsened by:  Bending, touching, movement and twisting Ineffective treatments:  OTC medications Associated symptoms: no abdominal pain, no bladder incontinence, no bowel incontinence, no chest pain, no fever, no headaches, no leg pain, no numbness, no paresthesias and no weakness   Risk factors: no recent surgery and no steroid use     Past Medical History  Diagnosis Date  . Heart attack     sept 14, 2015  . Bipolar 1 disorder    Past Surgical History  Procedure Laterality Date  . Coronary stent placement    . Knee surgery Left   . Hand surgery Right    Family History  Problem Relation Age of Onset  . Heart disease Father    Social History  Substance Use Topics  . Smoking status: Former Smoker -- 0.50 packs/day    Types: Cigarettes    Quit date: 11/27/2012  . Smokeless tobacco: Never Used  . Alcohol Use: 0.6 oz/week    0 Standard drinks or equivalent, 1 Cans of beer per week    Review of Systems  Constitutional: Negative for fever.    Cardiovascular: Negative for chest pain.  Gastrointestinal: Negative for abdominal pain and bowel incontinence.  Genitourinary: Negative for bladder incontinence.  Musculoskeletal: Positive for back pain.  Neurological: Negative for weakness, numbness, headaches and paresthesias.  All other systems reviewed and are negative.     Allergies  Review of patient's allergies indicates no known allergies.  Home Medications   Prior to Admission medications   Medication Sig Start Date End Date Taking? Authorizing Provider  aspirin 81 MG chewable tablet Chew 1 tablet (81 mg total) by mouth daily. 02/11/15  Yes Laqueta Linden, MD  atorvastatin (LIPITOR) 80 MG tablet Take 1 tablet (80 mg total) by mouth daily. 11/28/14  Yes Laqueta Linden, MD  b complex vitamins tablet Take 1 tablet by mouth daily.   Yes Historical Provider, MD  lisinopril (PRINIVIL,ZESTRIL) 2.5 MG tablet Take 1 tablet (2.5 mg total) by mouth daily. 11/28/14  Yes Laqueta Linden, MD  metoprolol succinate (TOPROL-XL) 25 MG 24 hr tablet Take 0.5 tablets (12.5 mg total) by mouth 2 (two) times daily. 12/22/14  Yes Laqueta Linden, MD  nitroGLYCERIN (NITROSTAT) 0.4 MG SL tablet Place 0.4 mg under the tongue every 5 (five) minutes as needed for chest pain (chest pain).   Yes Historical Provider, MD  omeprazole (PRILOSEC) 20 MG capsule Take 1 capsule (20 mg total) by mouth daily. Patient taking differently: Take 20 mg by  mouth daily as needed (acid reflux).  12/22/14  Yes Laqueta Linden, MD  ticagrelor (BRILINTA) 90 MG TABS tablet Take 1 tablet (90 mg total) by mouth 2 (two) times daily. 11/28/14  Yes Laqueta Linden, MD  acetaminophen (TYLENOL) 500 MG tablet Take 2 tablets (1,000 mg total) by mouth every 6 (six) hours as needed. 04/04/15   Gwyneth Sprout, MD  cyclobenzaprine (FLEXERIL) 10 MG tablet Take 1 tablet (10 mg total) by mouth 2 (two) times daily as needed for muscle spasms. 04/04/15   Gwyneth Sprout, MD    BP 128/87 mmHg  Pulse 55  Temp(Src) 98.4 F (36.9 C) (Oral)  Resp 16  SpO2 100% Physical Exam  Constitutional: He is oriented to person, place, and time. He appears well-developed and well-nourished. No distress.  HENT:  Head: Normocephalic and atraumatic.  Mouth/Throat: Oropharynx is clear and moist.  Eyes: Conjunctivae and EOM are normal. Pupils are equal, round, and reactive to light.  Neck: Normal range of motion. Neck supple.  Cardiovascular: Normal rate, regular rhythm and intact distal pulses.   No murmur heard. Pulmonary/Chest: Effort normal and breath sounds normal. No respiratory distress. He has no wheezes. He has no rales.  Abdominal: Soft. He exhibits no distension. There is no tenderness. There is no rebound and no guarding.  Musculoskeletal: Normal range of motion. He exhibits tenderness. He exhibits no edema.       Lumbar back: He exhibits tenderness, pain and spasm. He exhibits no bony tenderness and no swelling.       Back:  Neurological: He is alert and oriented to person, place, and time. He has normal strength. No sensory deficit.  5 out of 5 strength in the lower extremities bilaterally  Skin: Skin is warm and dry. No rash noted. No erythema.  Psychiatric: He has a normal mood and affect. His behavior is normal.  Nursing note and vitals reviewed.   ED Course  Procedures (including critical care time) Labs Review Labs Reviewed - No data to display  Imaging Review No results found. I have personally reviewed and evaluated these images and lab results as part of my medical decision-making.   EKG Interpretation None      MDM   Final diagnoses:  Muscle spasm of back   patient presenting complaining of left-sided back pain that started after moving multiple boxes at work. The pain is worsened over the last few days. It's exacerbated by movement. He denies any neurologic complaints or infectious complaints. He does not use IV drugs and has no cancer  history. Pain does not radiate.  Patient's symptoms are most classic for muscle spasm. No evidence of zoster and low suspicion for internal issues at this time such as kidney stone or abdominal process.  Patient sent home with Flexeril.  Gwyneth Sprout, MD 04/04/15 1148

## 2015-09-01 IMAGING — NM NM MISC PROCEDURE
6 series · 36 of 36 positions shown · non-contrast
Comparison: none

[Series 1: wbr_r-card_st rest · 6.4mm · 6.40mm/px · 6 of 24 frames shown]
[frame 3/24]
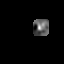
[frame 7/24]
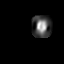
[frame 11/24]
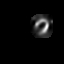
[frame 15/24]
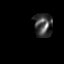
[frame 19/24]
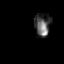
[frame 23/24]
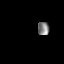

[Series 1: stress-gsp · 6.40mm/px · 6 of 507 frames shown]
[frame 43/507]
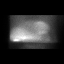
[frame 127/507]
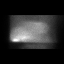
[frame 212/507]
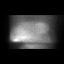
[frame 296/507]
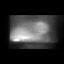
[frame 381/507]
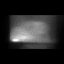
[frame 465/507]
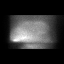

[Series 1: wbr_s-card_st stress-gsp · 6.4mm · 6.40mm/px · 6 of 232 frames shown]
[frame 20/232]
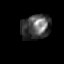
[frame 58/232]
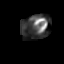
[frame 97/232]
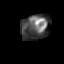
[frame 136/232]
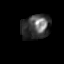
[frame 174/232]
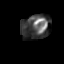
[frame 213/232]
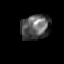

[Series 1: wbr_s-card_st stress-sum-em · 6.4mm · 6.40mm/px · 6 of 29 frames shown]
[frame 3/29]
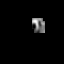
[frame 7/29]
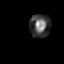
[frame 12/29]
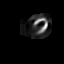
[frame 17/29]
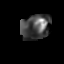
[frame 22/29]
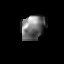
[frame 27/29]
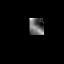

[Series 1: stress-sum-em · 6.40mm/px · 6 of 64 frames shown]
[frame 6/64]
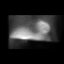
[frame 16/64]
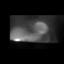
[frame 27/64]
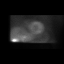
[frame 38/64]
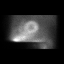
[frame 48/64]
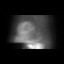
[frame 59/64]
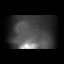

[Series 1: rest · 6.40mm/px · 6 of 64 frames shown]
[frame 6/64]
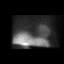
[frame 16/64]
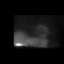
[frame 27/64]
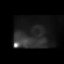
[frame 38/64]
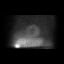
[frame 48/64]
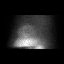
[frame 59/64]
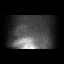

[36 of 36 positions shown; findings below may reference images not displayed]

Canned report from images found in remote index.

Refer to host system for actual result text.
